# Patient Record
Sex: Female | Born: 2005 | Race: White | Hispanic: No | Marital: Single
Health system: Southern US, Community
[De-identification: ages and names within clinical notes are randomized; demographics above are authoritative.]

## PROBLEM LIST (undated history)

## (undated) ENCOUNTER — Inpatient Hospital Stay (HOSPITAL_COMMUNITY): Payer: Self-pay

---
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
## 2024-03-19 LAB — OB RESULTS CONSOLE HGB/HCT, BLOOD
HCT: 39 (ref 29–41)
Hemoglobin: 12.6

## 2024-03-19 LAB — OB RESULTS CONSOLE RPR: RPR: NONREACTIVE

## 2024-03-19 LAB — OB RESULTS CONSOLE ABO/RH: "RH Type ": POSITIVE

## 2024-03-19 LAB — OB RESULTS CONSOLE PLATELET COUNT: Platelets: 293

## 2024-03-19 LAB — OB RESULTS CONSOLE HEPATITIS B SURFACE ANTIGEN: Hepatitis B Surface Ag: NEGATIVE

## 2024-03-19 LAB — OB RESULTS CONSOLE ANTIBODY SCREEN: Antibody Screen: NEGATIVE

## 2024-03-19 LAB — OB RESULTS CONSOLE RUBELLA ANTIBODY, IGM: Rubella: NON-IMMUNE/NOT IMMUNE

## 2024-03-19 LAB — HEPATITIS C ANTIBODY: HCV Ab: NEGATIVE

## 2024-03-19 LAB — VITAMIN D, 25-HYDROXY, TOTAL: Vit D, 25-Hydroxy: 26

## 2024-03-19 LAB — OB RESULTS CONSOLE HIV ANTIBODY (ROUTINE TESTING): HIV: NONREACTIVE

## 2024-05-03 ENCOUNTER — Encounter (HOSPITAL_COMMUNITY): Payer: Self-pay

## 2024-05-03 ENCOUNTER — Telehealth: Payer: Self-pay

## 2024-05-03 ENCOUNTER — Other Ambulatory Visit: Payer: Self-pay

## 2024-05-03 ENCOUNTER — Inpatient Hospital Stay (HOSPITAL_COMMUNITY)
Admission: AD | Admit: 2024-05-03 | Discharge: 2024-05-03 | Disposition: A | Discharge status: Home / Self Care | Attending: Obstetrics & Gynecology | Admitting: Obstetrics & Gynecology

## 2024-05-03 DIAGNOSIS — J069 Acute upper respiratory infection, unspecified: Secondary | ICD-10-CM

## 2024-05-03 DIAGNOSIS — J029 Acute pharyngitis, unspecified: Secondary | ICD-10-CM | POA: Diagnosis present

## 2024-05-03 DIAGNOSIS — O99512 Diseases of the respiratory system complicating pregnancy, second trimester: Secondary | ICD-10-CM | POA: Diagnosis not present

## 2024-05-03 DIAGNOSIS — Z3A19 19 weeks gestation of pregnancy: Secondary | ICD-10-CM

## 2024-05-03 DIAGNOSIS — R059 Cough, unspecified: Secondary | ICD-10-CM | POA: Diagnosis present

## 2024-05-03 LAB — GROUP A STREP BY PCR: Group A Strep by PCR: NOT DETECTED

## 2024-05-03 NOTE — MAU Note (Addendum)
 Traci Bridges is a 18 y.o. at Unknown here in MAU reporting: she began having a sore throat yesterday and today began having a productive cough, coughing up mucus.  Reports had mid back pain when during inhalation, but that has resolved.  Denies VB and LOF.  LMP: NA Onset of complaint: yesterday Pain score: 7 Vitals:   05/03/24 1738  BP: (!) 122/53  Pulse: 100  Resp: 20  Temp: 98.6 F (37 C)  SpO2: 99%     FHT: 154 bpm  Lab orders placed from triage: E Swab, Strep A

## 2024-05-03 NOTE — Discharge Instructions (Signed)

## 2024-05-03 NOTE — MAU Provider Note (Signed)
 " History     [id number]  Arrival date and time: 05/03/24 1722    Chief Complaint  Patient presents with   Sore Throat   Cough   Back Pain     HPI Traci Bridges is a 18 y.o. at [redacted]w[redacted]d who presents for sore throat x2 days. Has gotten progressively worse. Has had adenoids and tonsils removed. Associated symptoms include cough, and congestion. No fever or chills. No associated symptoms.        No past medical history on file.  No past surgical history on file.  No family history on file.  Social History   Socioeconomic History   Marital status: Single    Spouse name: Not on file   Number of children: Not on file   Years of education: Not on file   Highest education level: Not on file  Occupational History   Not on file  Tobacco Use   Smoking status: Not on file   Smokeless tobacco: Not on file  Substance and Sexual Activity   Alcohol use: Not on file   Drug use: Not on file   Sexual activity: Not on file  Other Topics Concern   Not on file  Social History Narrative   Not on file   Social Drivers of Health   Tobacco Use: Not on file  Financial Resource Strain: Not on file  Food Insecurity: Not on file  Transportation Needs: Not on file  Physical Activity: Not on file  Stress: Not on file  Social Connections: Not on file  Intimate Partner Violence: Not on file  Depression (PHQ2-9): Not on file  Alcohol Screen: Not on file  Housing: Not on file  Utilities: Not on file  Health Literacy: Not on file    Allergies[1]  Medications Ordered Prior to Encounter[2]  Pertinent positives and negative per HPI, all others reviewed and negative  Physical Exam   BP (!) 122/53   Pulse 100   Temp 98.6 F (37 C) (Oral)   Resp 20   Ht 5' 2 (1.575 m)   Wt 89.3 kg   SpO2 99%   BMI 36.00 kg/m   Patient Vitals for the past 24 hrs:  BP Temp Temp src Pulse Resp SpO2 Height Weight  05/03/24 1738 (!) 122/53 98.6 F (37 C) Oral 100 20 99 % -- --  05/03/24 1732  -- -- -- -- -- -- 5' 2 (1.575 m) 89.3 kg    Physical Exam Vitals and nursing note reviewed.  Constitutional:      Appearance: She is well-developed.  HENT:     Head: Normocephalic and atraumatic.     Mouth/Throat:     Mouth: Mucous membranes are moist.     Comments: Erythema to posterior oropharynx, no adenoids or tonsils noted.  Eyes:     Extraocular Movements: Extraocular movements intact.  Cardiovascular:     Rate and Rhythm: Normal rate and regular rhythm.  Pulmonary:     Effort: Pulmonary effort is normal.  Abdominal:     Palpations: Abdomen is soft.     Tenderness: There is no abdominal tenderness.  Skin:    Capillary Refill: Capillary refill takes less than 2 seconds.  Neurological:     General: No focal deficit present.     Mental Status: She is alert.     Labs Results for orders placed or performed during the hospital encounter of 05/03/24 (from the past 24 hours)  Group A Strep by PCR     Status:  None   Collection Time: 05/03/24  5:46 PM   Specimen: Throat; Sterile Swab  Result Value Ref Range   Group A Strep by PCR NOT DETECTED NOT DETECTED    Imaging No results found.  MAU Course  Procedures Lab Orders         Group A Strep by PCR    No orders of the defined types were placed in this encounter.  Imaging Orders  No imaging studies ordered today    MDM Low (Level 2)  Assessment and Plan  Viral URI  [redacted] weeks gestation of pregnancy  Traci Bridges is a 18 y.o. at [redacted]w[redacted]d who presents for sore throat x2 days.   -Likely viral URI. No fever, chills or other constitutional symptoms -GAS swab negative  -Stable for discharge home -Discussed OTC medications patient can take for symptomatic relief, list provided.        Shaquna Geigle L Perel Hauschild, MD/MHA 05/03/2024 9:13 PM  Allergies as of 05/03/2024   No Known Allergies      Medication List     TAKE these medications    Adderall XR 15 MG 24 hr capsule Generic drug:  amphetamine-dextroamphetamine Take by mouth every morning.   benzonatate  200 MG capsule Commonly known as: TESSALON  Take 1 capsule (200 mg total) by mouth 2 (two) times daily as needed for cough.   Jaimiess 0.15-0.03 &0.01 MG tablet Generic drug: Levonorgestrel-Ethinyl Estradiol Take 1 tablet by mouth daily.           [1] No Known Allergies [2]  No current facility-administered medications on file prior to encounter.   Current Outpatient Medications on File Prior to Encounter  Medication Sig Dispense Refill   ADDERALL XR 15 MG 24 hr capsule Take by mouth every morning.     benzonatate  (TESSALON ) 200 MG capsule Take 1 capsule (200 mg total) by mouth 2 (two) times daily as needed for cough. 20 capsule 0   JAIMIESS 0.15-0.03 &0.01 MG tablet Take 1 tablet by mouth daily.     "

## 2024-06-13 ENCOUNTER — Encounter: Payer: Self-pay | Admitting: Certified Nurse Midwife

## 2024-06-13 ENCOUNTER — Ambulatory Visit: Payer: Self-pay | Admitting: Certified Nurse Midwife

## 2024-06-13 ENCOUNTER — Other Ambulatory Visit: Payer: Self-pay

## 2024-06-13 VITALS — BP 119/67 | HR 81 | Wt 202.8 lb

## 2024-06-13 DIAGNOSIS — E559 Vitamin D deficiency, unspecified: Secondary | ICD-10-CM

## 2024-06-13 DIAGNOSIS — O288 Other abnormal findings on antenatal screening of mother: Secondary | ICD-10-CM | POA: Insufficient documentation

## 2024-06-13 DIAGNOSIS — Z2839 Other underimmunization status: Secondary | ICD-10-CM | POA: Insufficient documentation

## 2024-06-13 DIAGNOSIS — Z3490 Encounter for supervision of normal pregnancy, unspecified, unspecified trimester: Secondary | ICD-10-CM | POA: Insufficient documentation

## 2024-06-13 DIAGNOSIS — Z3A25 25 weeks gestation of pregnancy: Secondary | ICD-10-CM

## 2024-06-13 LAB — HEMOGLOBIN A1C: Hemoglobin A1c: 4.9

## 2024-06-13 NOTE — Patient Instructions (Signed)
   Considering Waterbirth? Guide for patients at [facility]) Why consider waterbirth? Gentle birth for babies  Less pain medicine used in labor  May allow for passive descent/less pushing  May reduce perineal tears  More mobility and instinctive maternal position changes  Increased maternal relaxation   Is waterbirth safe? What are the risks of infection, drowning or other complications? Infection:  Very low risk (3.7 % for tub vs 4.8% for bed)  7 in 8000 waterbirths with documented infection  Poorly cleaned equipment most common cause  Slightly lower group B strep transmission rate  Drowning  Maternal:  Very low risk  Related to seizures or fainting  Newborn:  Very low risk. No evidence of increased risk of respiratory problems in multiple large studies  Physiological protection from breathing under water  Avoid underwater birth if there are any fetal complications  Once baby's head is out of the water, keep it out.  Birth complication  Some reports of cord trauma, but risk decreased by bringing baby to surface gradually  No evidence of increased risk of shoulder dystocia. Mothers can usually change positions faster in water than in a bed, possibly aiding the maneuvers to free the shoulder.   There are 2 things you MUST do to have a waterbirth with [facility]: Attend a waterbirth class at [facility]   3rd Wednesday of every month from 7-9 pm (virtual during COVID) [employer] at [email or web address] or [email or web address] or by calling [phone] Bring us  the certificate from the class to your prenatal appointment or send via MyChart Meet with a midwife at 36 weeks* to see if you can still plan a waterbirth and to sign the consent.   *We also recommend that you schedule as many of your prenatal visits with a midwife as possible.    Helpful information: You may want to bring a bathing suit top to the hospital  to wear during labor but this is optional.  All other supplies are provided by the hospital. Please arrive at the hospital with signs of active labor, and do not wait at home until late in labor. It takes 45 min- 1 hour for fetal monitoring, and check in to your room to take place, plus transport and filling of the waterbirth tub.    Things that would prevent you from having a waterbirth: Premature, <37wks  Previous cesarean birth  Presence of thick meconium-stained fluid  Multiple gestation (Twins, triplets, etc.)  Uncontrolled diabetes or gestational diabetes requiring medication  Hypertension diagnosed in pregnancy or preexisting hypertension (gestational hypertension, preeclampsia, or chronic hypertension) Fetal growth restriction (your baby measures less than 10th percentile on ultrasound) Heavy vaginal bleeding  Non-reassuring fetal heart rate  Active infection (MRSA, etc.). Group B Strep is NOT a contraindication for waterbirth.  If your labor has to be induced and induction method requires continuous monitoring of the baby's heart rate  Other risks/issues identified by your obstetrical provider   Please remember that birth is unpredictable. Under certain unforeseeable circumstances your provider may advise against giving birth in the tub. These decisions will be made on a case-by-case basis and with the safety of you and your baby as our highest priority.    Updated 08/12/21

## 2024-06-13 NOTE — Progress Notes (Addendum)
 "  History:   Traci Bridges is a 19 y.o. G1P0 at [redacted]w[redacted]d by LMP being seen today for her first obstetrical visit.  Her obstetrical history is uncomplicated . Patient does intend to breast feed. Pregnancy history fully reviewed.  Patient reports occasional leg cramps, dependent edema, and hip pain while sidelying.      HISTORY: OB History  Gravida Para Term Preterm AB Living  1 0 0 0 0 0  SAB IAB Ectopic Multiple Live Births  0 0 0 0 0    # Outcome Date GA Lbr Len/2nd Weight Sex Type Anes PTL Lv  1 Current             Last pap smear was done: N/A Traci Bridges just turned 19yo and has never had a pap smear. She declines for now, and opts for screening at her pp appointment.  No past medical history on file. Past Surgical History:  Procedure Laterality Date   TONSILLECTOMY     WISDOM TOOTH EXTRACTION Bilateral    No family history on file. Social History[1] Allergies[2] Medications Ordered Prior to Encounter[3]  Review of Systems Pertinent items noted in HPI and remainder of comprehensive ROS otherwise negative. Physical Exam:   Vitals:   06/13/24 1035  BP: 119/67  Pulse: 81  Weight: 202 lb 12.8 oz (92 kg)      Constitutional: Well-developed, well-nourished pregnant female in no acute distress.  HEENT: PERRLA Skin: normal color and turgor, no rash Cardiovascular: normal rate & rhythm, warm and well perfused Respiratory: normal effort, no problems with respiration noted GI: Abd soft, non-distended MS: Extremities nontender, no edema, normal ROM Neurologic: Alert and oriented x 4.  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 Encounter for supervision of low-risk pregnancy, antepartum (Primary) -  Transfer from [facility], labs and history reviewed. - Doing well overall - Encouraged wearing compression socks to help with sore feet at work. - Belly band given for back support at work, and stacked pillows for support when sidelying - Recommended magnesium at night for sleep and leg cramps.  2. [redacted] weeks gestation of pregnancy - Routine Ob care -.cwhwat - next appointment GTT - Pt is interested in waterbirth.  No contraindications at this time per chart review/patient assessment.   - Pt to enroll in class, see CNMs for most visits in the office.  - Discussed waterbirth as option for low-risk pregnancy.  Reviewed conditions that may arise during pregnancy that will risk pt out of waterbirth including hypertension, diabetes, fetal growth restriction <10%ile, etc.  3. Rubella non-immune status, antepartum - Reviewed with patient. Plans to get booster PP  4. 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 Vitamin D deficiency - She is currently taking Vit D supplements   - Initial labs drawn. - Continue prenatal vitamins. - Problem list reviewed and updated. - Genetic Screening discussed,  - Ultrasound discussed; fetal anatomic survey: results reviewed. - Anticipatory guidance about prenatal visits given including labs, ultrasounds, and testing. - Encouraged to complete MyChart Registration for her ability to review results, send requests, and have questions addressed.  - The nature of [name] [facility] with multiple  MDs and Advanced Practice Providers was explained to patient; also emphasized that residents, students are part of our team. - Routine obstetric precautions reviewed. Encouraged to seek out care at office or emergency room [facility] preferred) for urgent and/or emergent concerns.  Return in about 4 weeks (around 07/11/2024) for CNM, LOB GTT.    Future Appointments  Date Time  Provider Department Center  07/09/2024  8:20 AM [facility]  07/11/2024  3:55 PM Vannie Cornell SAUNDERS, CNM [facility]  07/25/2024 11:15 AM Vannie Cornell SAUNDERS, CNM [facility]  08/08/2024  1:55 PM Vannie Cornell SAUNDERS EDDY [facility]  08/22/2024  2:35 PM Vannie Cornell SAUNDERS EDDY [facility]  08/29/2024  2:35 PM Vannie Cornell SAUNDERS EDDY [facility]  09/05/2024  2:35 PM Vannie Cornell SAUNDERS EDDY [facility]  09/12/2024 11:15 AM Vannie Cornell SAUNDERS, CNM [facility]  09/19/2024  1:15 PM Vannie Cornell SAUNDERS EDDY [facility]  09/26/2024 11:15 AM Vannie Cornell SAUNDERS, CNM [facility]    Mitzie Molly, MSN, CNM Certified Nurse Midwife,  Medical Group        [1]  Social History Tobacco Use   Smoking status: Never  Vaping Use   Vaping status: Some Days  Substance Use Topics   Alcohol use: Never   Drug use: Never  [2] No Known Allergies [3]  Current Outpatient Medications on File Prior to Visit  Medication Sig Dispense Refill   Cholecalciferol (VITAMIN D) 50 MCG (2000 UT) CAPS Take by mouth daily.     Prenatal Vit-Fe Phos-FA-Omega (VITAFOL GUMMIES) 3.33-0.333-34.8 MG CHEW Chew 3 tablets by mouth.     No current facility-administered medications on file prior to visit.   "

## 2024-07-09 ENCOUNTER — Other Ambulatory Visit: Payer: Self-pay

## 2024-07-11 ENCOUNTER — Encounter: Payer: Self-pay | Admitting: Certified Nurse Midwife

## 2024-07-25 ENCOUNTER — Encounter: Payer: Self-pay | Admitting: Certified Nurse Midwife

## 2024-08-08 ENCOUNTER — Encounter: Payer: Self-pay | Admitting: Certified Nurse Midwife

## 2024-08-22 ENCOUNTER — Encounter: Payer: Self-pay | Admitting: Certified Nurse Midwife

## 2024-08-29 ENCOUNTER — Encounter: Payer: Self-pay | Admitting: Certified Nurse Midwife

## 2024-09-05 ENCOUNTER — Encounter: Payer: Self-pay | Admitting: Certified Nurse Midwife

## 2024-09-12 ENCOUNTER — Encounter: Payer: Self-pay | Admitting: Certified Nurse Midwife

## 2024-09-19 ENCOUNTER — Encounter: Payer: Self-pay | Admitting: Certified Nurse Midwife

## 2024-09-26 ENCOUNTER — Encounter: Payer: Self-pay | Admitting: Certified Nurse Midwife
